# Patient Record
Sex: Male | Born: 1965 | Race: White | Hispanic: No | Marital: Married | Smoking: Former smoker
Health system: Southern US, Community
[De-identification: ages and names within clinical notes are randomized; demographics above are authoritative.]

## PROBLEM LIST (undated history)

## (undated) DIAGNOSIS — Z87442 Personal history of urinary calculi: Secondary | ICD-10-CM

## (undated) DIAGNOSIS — I509 Heart failure, unspecified: Secondary | ICD-10-CM

## (undated) DIAGNOSIS — I10 Essential (primary) hypertension: Secondary | ICD-10-CM

## (undated) DIAGNOSIS — N289 Disorder of kidney and ureter, unspecified: Secondary | ICD-10-CM

## (undated) DIAGNOSIS — I219 Acute myocardial infarction, unspecified: Secondary | ICD-10-CM

## (undated) HISTORY — PX: OTHER SURGICAL HISTORY: SHX169

## (undated) HISTORY — PX: CORONARY ANGIOPLASTY WITH STENT PLACEMENT: SHX49

---

## 2017-07-05 ENCOUNTER — Emergency Department
Admission: EM | Admit: 2017-07-05 | Discharge: 2017-07-05 | Disposition: A | Discharge status: Home / Self Care | Payer: BLUE CROSS/BLUE SHIELD | Attending: Emergency Medicine | Admitting: Emergency Medicine

## 2017-07-05 ENCOUNTER — Encounter: Payer: Self-pay | Admitting: Emergency Medicine

## 2017-07-05 ENCOUNTER — Emergency Department: Payer: BLUE CROSS/BLUE SHIELD

## 2017-07-05 DIAGNOSIS — K5792 Diverticulitis of intestine, part unspecified, without perforation or abscess without bleeding: Secondary | ICD-10-CM | POA: Diagnosis not present

## 2017-07-05 DIAGNOSIS — Z955 Presence of coronary angioplasty implant and graft: Secondary | ICD-10-CM | POA: Diagnosis not present

## 2017-07-05 DIAGNOSIS — R1032 Left lower quadrant pain: Secondary | ICD-10-CM | POA: Diagnosis present

## 2017-07-05 DIAGNOSIS — Z87891 Personal history of nicotine dependence: Secondary | ICD-10-CM | POA: Diagnosis not present

## 2017-07-05 HISTORY — DX: Acute myocardial infarction, unspecified: I21.9

## 2017-07-05 HISTORY — DX: Essential (primary) hypertension: I10

## 2017-07-05 HISTORY — DX: Disorder of kidney and ureter, unspecified: N28.9

## 2017-07-05 HISTORY — DX: Heart failure, unspecified: I50.9

## 2017-07-05 LAB — COMPREHENSIVE METABOLIC PANEL
ALBUMIN: 4.4 g/dL (ref 3.5–5.0)
ALT: 18 U/L (ref 17–63)
AST: 24 U/L (ref 15–41)
Alkaline Phosphatase: 37 U/L — ABNORMAL LOW (ref 38–126)
Anion gap: 10 (ref 5–15)
BUN: 10 mg/dL (ref 6–20)
CHLORIDE: 101 mmol/L (ref 101–111)
CO2: 27 mmol/L (ref 22–32)
CREATININE: 0.94 mg/dL (ref 0.61–1.24)
Calcium: 9.2 mg/dL (ref 8.9–10.3)
GFR calc Af Amer: >60 mL/min (ref >60)
GFR calc non Af Amer: >60 mL/min (ref >60)
GLUCOSE: 93 mg/dL (ref 65–99)
POTASSIUM: 4.4 mmol/L (ref 3.5–5.1)
SODIUM: 138 mmol/L (ref 135–145)
Total Bilirubin: 1.1 mg/dL (ref 0.3–1.2)
Total Protein: 7.9 g/dL (ref 6.5–8.1)

## 2017-07-05 LAB — CBC
HEMATOCRIT: 42.6 % (ref 40.0–52.0)
Hemoglobin: 14.1 g/dL (ref 13.0–18.0)
MCH: 29.4 pg (ref 26.0–34.0)
MCHC: 33.1 g/dL (ref 32.0–36.0)
MCV: 88.9 fL (ref 80.0–100.0)
PLATELETS: 199 10*3/uL (ref 150–440)
RBC: 4.79 MIL/uL (ref 4.40–5.90)
RDW: 14.4 % (ref 11.5–14.5)
WBC: 11.3 10*3/uL — AB (ref 3.8–10.6)

## 2017-07-05 LAB — URINALYSIS, COMPLETE (UACMP) WITH MICROSCOPIC
BACTERIA UA: NONE SEEN
Bilirubin Urine: NEGATIVE
Glucose, UA: NEGATIVE mg/dL
Hgb urine dipstick: NEGATIVE
Ketones, ur: NEGATIVE mg/dL
LEUKOCYTES UA: NEGATIVE
Nitrite: NEGATIVE
PH: 7 (ref 5.0–8.0)
Protein, ur: NEGATIVE mg/dL
Specific Gravity, Urine: 1.005 (ref 1.005–1.030)

## 2017-07-05 LAB — LIPASE, BLOOD: LIPASE: 31 U/L (ref 11–51)

## 2017-07-05 MED ORDER — HYDROCODONE-ACETAMINOPHEN 5-325 MG PO TABS: 1.0000 | ORAL_TABLET | ORAL | 0 refills | Status: DC | PRN

## 2017-07-05 MED ORDER — AMOXICILLIN-POT CLAVULANATE 875-125 MG PO TABS
ORAL_TABLET | ORAL | Status: AC
  Administered 2017-07-05: 1 via ORAL
  Filled 2017-07-05: qty 1

## 2017-07-05 MED ORDER — TRAMADOL HCL 50 MG PO TABS: 50.0000 mg | ORAL_TABLET | Freq: Four times a day (QID) | ORAL | 0 refills | Status: AC | PRN

## 2017-07-05 MED ORDER — IOPAMIDOL (ISOVUE-300) INJECTION 61%
30.0000 mL | Freq: Once | INTRAVENOUS | Status: AC | PRN
  Administered 2017-07-05: 30 mL via ORAL

## 2017-07-05 MED ORDER — ONDANSETRON HCL 4 MG PO TABS: 4.0000 mg | ORAL_TABLET | Freq: Every day | ORAL | 0 refills | Status: AC | PRN

## 2017-07-05 MED ORDER — AMOXICILLIN-POT CLAVULANATE 875-125 MG PO TABS
1.0000 | ORAL_TABLET | Freq: Once | ORAL | Status: AC
  Administered 2017-07-05: 1 via ORAL

## 2017-07-05 MED ORDER — AMOXICILLIN-POT CLAVULANATE 875-125 MG PO TABS: 1.0000 | ORAL_TABLET | Freq: Two times a day (BID) | ORAL | 0 refills | Status: AC

## 2017-07-05 MED ORDER — SODIUM CHLORIDE 0.9 % IV BOLUS
1000.0000 mL | Freq: Once | INTRAVENOUS | Status: AC
  Administered 2017-07-05: 1000 mL via INTRAVENOUS

## 2017-07-05 MED ORDER — IOPAMIDOL (ISOVUE-370) INJECTION 76%
100.0000 mL | Freq: Once | INTRAVENOUS | Status: AC | PRN
  Administered 2017-07-05: 100 mL via INTRAVENOUS

## 2017-07-05 NOTE — ED Triage Notes (Signed)
Pt comes into the ED via POV c/o left abdominal pain that radiates into the left testicle.  Patient in NAD at this time.  DEnies any N/V/D.  Patient has extensive h/o kidney stones but states this does not feel the same.  Denies any difficulty urinating at this time.  Denies any swelling in the testicle.

## 2017-07-05 NOTE — Discharge Instructions (Addendum)
Your work-up today shows diverticulitis.  Please take antibiotics as prescribed, pain and nausea medication as needed as written.  These follow-up with your primary care doctor.  Return to the emergency department for any acute worsening of abdominal pain or development of significant fever.

## 2017-07-05 NOTE — ED Provider Notes (Signed)
[facility] Emergency Department Provider Note  Time seen: 1:33 PM  I have reviewed the triage vital signs and the nursing notes.   HISTORY  Chief Complaint Abdominal Pain    HPI Kevin White is a 52 y.o. male with a past medical history of MI, kidney stones, presents to the emergency department with left lower quadrant abdominal pain.  According to the patient since yesterday he has been experiencing left lower quadrant abdominal pain, low-grade fevers and intermittent nausea.  Denies any diarrhea, denies black or bloody stool.  Does state earlier this week he was feeling rather bloated but that has since resolved.  No chest pain or shortness of breath.  No dysuria or hematuria.  History of kidney stones in the past but states this feels different.  Describes the pain currently as moderate dull pain.  Worse with any type of movement.   Past Medical History:  Diagnosis Date  . Myocardial infarct ([place])   . Renal disorder    kidney stones    There are no active problems to display for this patient.   Past Surgical History:  Procedure Laterality Date  . CORONARY ANGIOPLASTY WITH STENT PLACEMENT      Prior to Admission medications   Not on File    No Known Allergies  No family history on file.  Social History Social History   Tobacco Use  . Smoking status: Former Research scientist (life sciences)  . Smokeless tobacco: Never Used  Substance Use Topics  . Alcohol use: Yes  . Drug use: Not Currently    Review of Systems Constitutional: Low-grade fever, 99.6 in the emergency department. Eyes: Negative for visual complaints ENT: Negative for recent illness/congestion Cardiovascular: Negative for chest pain. Respiratory: Negative for shortness of breath. Gastrointestinal: Moderate left lower quadrant abdominal pain, positive for nausea.  Negative for vomiting or diarrhea.  Negative for black or bloody stool. Genitourinary: Negative for urinary compaints and negative for  hematuria. Musculoskeletal: Negative for musculoskeletal complaints Skin: Negative for skin complaints  Neurological: Negative for headache All other ROS negative  ____________________________________________   PHYSICAL EXAM:  VITAL SIGNS: ED Triage Vitals [07/05/17 1130]  Enc Vitals Group     BP (!) 150/79     Pulse Rate 80     Resp 18     Temp 99 F (37.2 C)     Temp Source Oral     SpO2 97 %     Weight (!) 320 lb (145.2 kg)     Height 6\' 1"  (1.854 m)     Head Circumference      Peak Flow      Pain Score 8     Pain Loc      Pain Edu?      Excl. in [place]?    Constitutional: Alert and oriented. Well appearing and in no distress. Eyes: Normal exam ENT   Head: Normocephalic and atraumatic.   Mouth/Throat: Mucous membranes are moist. Cardiovascular: Normal rate, regular rhythm. No murmur Respiratory: Normal respiratory effort without tachypnea nor retractions. Breath sounds are clear Gastrointestinal: Soft, moderate left lower quadrant abdominal pain.  No rebound or guarding.  No distention. Musculoskeletal: Nontender with normal range of motion in all extremities Neurologic:  Normal speech and language. No gross focal neurologic deficits  Skin:  Skin is warm, dry and intact.  Psychiatric: Mood and affect are normal.   ____________________________________________    RADIOLOGY  CT consistent with acute diverticulitis without complication.  ____________________________________________   INITIAL IMPRESSION / ASSESSMENT  AND PLAN / ED COURSE  Pertinent labs & imaging results that were available during my care of the patient were reviewed by me and considered in my medical decision making (see chart for details).  Patient presents to the emergency department with moderate left lower quadrant pain and low-grade fever.  Differential would include colitis, diverticulitis, urinary tract infection, pyelonephritis, ureterolithiasis.  Given the patient's exam with  moderate tenderness to palpation in the left lower quadrant I highly suspect diverticulitis/colitis.  We will obtain a CT scan of the abdomen/pelvis to further evaluate.  We will place an IV, IV hydrate while awaiting CT results.  Patient's basic lab work is largely within normal limits besides a very slight leukocytosis.  No blood in his urinalysis.  CT consistent with uncomplicated acute diverticulitis.  We will place the patient on antibiotics pain and nausea medication.  We will have the patient follow-up with his primary care doctor.  I discussed return precautions for any worsening pain.  ____________________________________________   FINAL CLINICAL IMPRESSION(S) / ED DIAGNOSES  Lower quadrant pain Acute diverticulitis   Harvest Dark, MD 07/05/17 1505

## 2017-11-28 ENCOUNTER — Other Ambulatory Visit: Payer: Self-pay | Admitting: Chiropractic Medicine

## 2017-11-28 DIAGNOSIS — M531 Cervicobrachial syndrome: Secondary | ICD-10-CM

## 2017-11-29 ENCOUNTER — Other Ambulatory Visit: Payer: Self-pay | Admitting: Chiropractic Medicine

## 2017-12-04 ENCOUNTER — Ambulatory Visit
Admission: RE | Admit: 2017-12-04 | Discharge: 2017-12-04 | Disposition: A | Discharge status: Home / Self Care | Payer: BLUE CROSS/BLUE SHIELD | Source: Ambulatory Visit | Attending: Chiropractic Medicine | Admitting: Chiropractic Medicine

## 2017-12-04 DIAGNOSIS — M47812 Spondylosis without myelopathy or radiculopathy, cervical region: Secondary | ICD-10-CM | POA: Insufficient documentation

## 2017-12-04 DIAGNOSIS — M531 Cervicobrachial syndrome: Secondary | ICD-10-CM | POA: Diagnosis present

## 2017-12-16 ENCOUNTER — Ambulatory Visit: Payer: BLUE CROSS/BLUE SHIELD

## 2018-07-15 ENCOUNTER — Other Ambulatory Visit: Payer: Self-pay | Admitting: Otolaryngology

## 2018-07-15 ENCOUNTER — Other Ambulatory Visit (HOSPITAL_COMMUNITY): Payer: Self-pay | Admitting: Otolaryngology

## 2018-07-15 DIAGNOSIS — R221 Localized swelling, mass and lump, neck: Secondary | ICD-10-CM

## 2018-07-18 ENCOUNTER — Ambulatory Visit
Admission: RE | Admit: 2018-07-18 | Discharge: 2018-07-18 | Disposition: A | Discharge status: Home / Self Care | Payer: BC Managed Care – PPO | Source: Ambulatory Visit | Attending: Otolaryngology | Admitting: Otolaryngology

## 2018-07-18 ENCOUNTER — Other Ambulatory Visit: Payer: Self-pay

## 2018-07-18 ENCOUNTER — Other Ambulatory Visit
Admission: RE | Admit: 2018-07-18 | Discharge: 2018-07-18 | Disposition: A | Discharge status: Home / Self Care | Payer: BC Managed Care – PPO | Source: Ambulatory Visit | Attending: Otolaryngology | Admitting: Otolaryngology

## 2018-07-18 ENCOUNTER — Encounter (INDEPENDENT_AMBULATORY_CARE_PROVIDER_SITE_OTHER): Payer: Self-pay

## 2018-07-18 DIAGNOSIS — R221 Localized swelling, mass and lump, neck: Secondary | ICD-10-CM | POA: Insufficient documentation

## 2018-07-18 LAB — CREATININE, SERUM
Creatinine, Ser: 0.93 mg/dL (ref 0.61–1.24)
GFR calc Af Amer: >60 mL/min (ref >60)
GFR calc non Af Amer: >60 mL/min (ref >60)

## 2018-07-18 LAB — BUN: BUN: 15 mg/dL (ref 6–20)

## 2018-07-18 MED ORDER — IOHEXOL 300 MG/ML  SOLN
75.0000 mL | Freq: Once | INTRAMUSCULAR | Status: AC | PRN
  Administered 2018-07-18: 75 mL via INTRAVENOUS

## 2018-07-22 ENCOUNTER — Other Ambulatory Visit: Payer: Self-pay | Admitting: Otolaryngology

## 2018-07-22 DIAGNOSIS — R221 Localized swelling, mass and lump, neck: Secondary | ICD-10-CM

## 2018-07-25 ENCOUNTER — Other Ambulatory Visit: Payer: Self-pay | Admitting: Radiology

## 2018-07-29 ENCOUNTER — Other Ambulatory Visit: Payer: Self-pay

## 2018-07-29 ENCOUNTER — Ambulatory Visit: Payer: BC Managed Care – PPO

## 2018-07-29 ENCOUNTER — Ambulatory Visit
Admission: RE | Admit: 2018-07-29 | Discharge: 2018-07-29 | Disposition: A | Discharge status: Home / Self Care | Payer: BC Managed Care – PPO | Source: Ambulatory Visit | Attending: Otolaryngology | Admitting: Otolaryngology

## 2018-07-29 ENCOUNTER — Other Ambulatory Visit: Payer: Self-pay | Admitting: Otolaryngology

## 2018-07-29 DIAGNOSIS — Z87891 Personal history of nicotine dependence: Secondary | ICD-10-CM | POA: Diagnosis not present

## 2018-07-29 DIAGNOSIS — C770 Secondary and unspecified malignant neoplasm of lymph nodes of head, face and neck: Secondary | ICD-10-CM | POA: Diagnosis not present

## 2018-07-29 DIAGNOSIS — Z955 Presence of coronary angioplasty implant and graft: Secondary | ICD-10-CM | POA: Insufficient documentation

## 2018-07-29 DIAGNOSIS — C801 Malignant (primary) neoplasm, unspecified: Secondary | ICD-10-CM | POA: Diagnosis not present

## 2018-07-29 DIAGNOSIS — I252 Old myocardial infarction: Secondary | ICD-10-CM | POA: Insufficient documentation

## 2018-07-29 DIAGNOSIS — R221 Localized swelling, mass and lump, neck: Secondary | ICD-10-CM

## 2018-07-29 DIAGNOSIS — Z7982 Long term (current) use of aspirin: Secondary | ICD-10-CM | POA: Insufficient documentation

## 2018-07-29 DIAGNOSIS — I110 Hypertensive heart disease with heart failure: Secondary | ICD-10-CM | POA: Insufficient documentation

## 2018-07-29 DIAGNOSIS — Z87442 Personal history of urinary calculi: Secondary | ICD-10-CM | POA: Diagnosis not present

## 2018-07-29 DIAGNOSIS — I509 Heart failure, unspecified: Secondary | ICD-10-CM | POA: Diagnosis not present

## 2018-07-29 DIAGNOSIS — Z79899 Other long term (current) drug therapy: Secondary | ICD-10-CM | POA: Insufficient documentation

## 2018-07-29 DIAGNOSIS — R590 Localized enlarged lymph nodes: Secondary | ICD-10-CM | POA: Diagnosis present

## 2018-07-29 HISTORY — DX: Personal history of urinary calculi: Z87.442

## 2018-07-29 MED ORDER — SODIUM CHLORIDE 0.9 % IV SOLN
INTRAVENOUS | Status: DC
  Administered 2018-07-29: 11:00:00 via INTRAVENOUS

## 2018-07-29 MED ORDER — FENTANYL CITRATE (PF) 100 MCG/2ML IJ SOLN
INTRAMUSCULAR | Status: AC | PRN
  Administered 2018-07-29: 50 ug via INTRAVENOUS
  Administered 2018-07-29: 25 ug via INTRAVENOUS

## 2018-07-29 MED ORDER — MIDAZOLAM HCL 5 MG/5ML IJ SOLN
INTRAMUSCULAR | Status: AC | PRN
  Administered 2018-07-29: 0.5 mg via INTRAVENOUS
  Administered 2018-07-29 (×2): 1 mg via INTRAVENOUS
  Administered 2018-07-29: 0.5 mg via INTRAVENOUS

## 2018-07-29 NOTE — H&P (Signed)
Chief Complaint: Patient was seen in consultation today for left cervical lymph node biopsy at the request of Bennett,Paul  Referring Physician(s): Bennett,Paul  Patient Status: [facility] - Out-pt  History of Present Illness: Kevin White is a 53 y.o. male who recently palpated a left neck mass. CT demonstrates a 2.5 x 3.5 cm enlarged left level IIb cervical lymph node. He presents today for biopsy. He has some discomfort in left neck related to the LN but denies fever, chills, other symptoms.  Past Medical History:  Diagnosis Date  . CHF (congestive heart failure) ([place])   . History of kidney stones   . Hypertension   . Myocardial infarct ([place])   . Renal disorder    kidney stones    Past Surgical History:  Procedure Laterality Date  . CORONARY ANGIOPLASTY WITH STENT PLACEMENT    . gastic sleeve      Allergies: Patient has no known allergies.  Medications: Prior to Admission medications   Medication Sig Start Date End Date Taking? Authorizing Provider  aspirin EC 81 MG tablet Take 81 mg by mouth daily.   Yes [provider]  lisinopril (ZESTRIL) 20 MG tablet Take 20 mg by mouth daily.   Yes [provider]  metoprolol succinate (TOPROL-XL) 25 MG 24 hr tablet Take 25 mg by mouth daily.   Yes [provider]  simvastatin (ZOCOR) 40 MG tablet Take 40 mg by mouth daily.   Yes [provider]  ondansetron (ZOFRAN) 4 MG tablet Take 1 tablet (4 mg total) by mouth daily as needed for nausea or vomiting. Patient not taking: Reported on 07/29/2018 07/05/17   Harvest Dark, MD     History reviewed. No pertinent family history.  Social History   Socioeconomic History  . Marital status: Married    Spouse name: Not on file  . Number of children: 3  . Years of education: Not on file  . Highest education level: Not on file  Occupational History  . Not on file  Social Needs  . Financial resource strain: Not hard at all  . Food  insecurity    Worry: Patient refused    Inability: Patient refused  . Transportation needs    Medical: Patient refused    Non-medical: Patient refused  Tobacco Use  . Smoking status: Former Smoker    Quit date: 07/29/2010    Years since quitting: 8.0  . Smokeless tobacco: Never Used  Substance and Sexual Activity  . Alcohol use: Yes    Alcohol/week: 2.0 standard drinks    Types: 2 Shots of liquor per week  . Drug use: Not Currently  . Sexual activity: Not on file  Lifestyle  . Physical activity    Days per week: 7 days    Minutes per session: 30 min  . Stress: Very much  Relationships  . Social connections    Talks on phone: More than three times a week    Gets together: More than three times a week    Attends religious service: Never    Active member of club or organization: No    Attends meetings of clubs or organizations: Never    Relationship status: Married  Other Topics Concern  . Not on file  Social History Narrative  . Not on file     Review of Systems: A 12 point ROS discussed and pertinent positives are indicated in the HPI above.  All other systems are negative.  Review of Systems  Constitutional: Negative.  HENT:       Palpable left neck mass.  Respiratory: Negative.   Cardiovascular: Negative.   Gastrointestinal: Negative.   Genitourinary: Negative.   Musculoskeletal: Negative.   Neurological: Negative.     Vital Signs: BP 130/81   Pulse 76   Temp 98.8 F (37.1 C) (Oral)   Resp 20   Ht 6\' 1"  (1.854 m)   Wt (!) 137.9 kg   SpO2 95%   BMI 40.11 kg/m   Physical Exam Vitals signs reviewed.  Constitutional:      General: He is not in acute distress.    Appearance: Normal appearance. He is not ill-appearing, toxic-appearing or diaphoretic.  HENT:     Head: Normocephalic and atraumatic.     Mouth/Throat:     Mouth: Mucous membranes are moist.     Pharynx: Oropharynx is clear. No oropharyngeal exudate or posterior oropharyngeal erythema.   Neck:     Musculoskeletal: Neck supple.     Comments: Visible bulge of upper lateral left neck with palpable enlarged lymph node. Cardiovascular:     Rate and Rhythm: Normal rate and regular rhythm.     Heart sounds: Normal heart sounds. No murmur. No friction rub. No gallop.   Pulmonary:     Effort: Pulmonary effort is normal. No respiratory distress.     Breath sounds: Normal breath sounds. No stridor. No wheezing, rhonchi or rales.  Abdominal:     General: Abdomen is flat. There is no distension.     Palpations: Abdomen is soft.     Tenderness: There is no abdominal tenderness. There is no guarding or rebound.  Musculoskeletal:        General: No swelling.  Lymphadenopathy:     Cervical: Cervical adenopathy present.  Skin:    General: Skin is warm and dry.  Neurological:     General: No focal deficit present.     Mental Status: He is alert and oriented to person, place, and time.     Imaging: Ct Soft Tissue Neck W Contrast  Result Date: 07/18/2018 CLINICAL DATA:  53 year old male with left neck mass noticed 2 days ago. No known injury. EXAM: CT NECK WITH CONTRAST TECHNIQUE: Multidetector CT imaging of the neck was performed using the standard protocol following the bolus administration of intravenous contrast. CONTRAST:  19mL OMNIPAQUE IOHEXOL 300 MG/ML  SOLN COMPARISON:  Cervical spine MRI 12/04/2017 FINDINGS: Pharynx and larynx: Negative larynx. Hypopharynx contours are within normal limits. At the level of the oropharynx just below the soft palate there is subtle asymmetric increased size of the left tonsil on series 2, image 40. No discrete tonsillar hyperenhancement or mass. Nasopharynx contours are within normal limits. Negative parapharyngeal spaces. Negative retropharyngeal space. Salivary glands: Negative sublingual space. Submandibular glands and parotid glands appear symmetric and within normal limits. Thyroid: Negative. Lymph nodes: The palpable area of concern marked on  coronal image 66 corresponds to a 35 millimeter long axis, 24 millimeter short axis heterogeneously enhancing enlarged and lobulated left level 2 B lymph node. See also series 2, image 51. Much smaller regional lymph nodes also appear mildly rounded and asymmetrically increased in size (coronal image 59). There is a adjacent mildly enlarged and heterogeneous left level IIa node measuring 11 millimeters short axis. Left level 4 lymph nodes are also mildly asymmetric but remain normal by size criteria on series 2, image 87. Other bilateral lymph node stations are normal. Vascular: Major vascular structures in the neck and at the skull base are patent. Left  greater than right ICA origin calcified atherosclerosis. Partially visible ICA siphon calcified plaque. Limited intracranial: Negative. Visualized orbits: Minimally included, negative. Mastoids and visualized paranasal sinuses: Clear. Skeleton: Mildly elongated stylohyoid ligament calcification. No acute or suspicious osseous lesion. Upper chest: Negative lung apices and visible superior mediastinum. IMPRESSION: Palpable area of concern corresponds to an enlarged, heterogeneous and malignant appearing left level 2 lymph node (35 mm long axis) with smaller surrounding abnormal nodes. No contralateral lymphadenopathy. 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 However, there is no discrete tonsillar mass by CT, so the differential diagnosis includes lymphoma and metastatic disease from a different primary. Electronically Signed   By: Genevie Ann M.D.   On: 07/18/2018 19:34    Labs:  CBC: No results for input(s): WBC, HGB, HCT, PLT in the last 8760 hours.  COAGS: No results for input(s): INR, APTT in the last 8760 hours.  BMP: Recent Labs    07/18/18 1350  BUN 15  CREATININE 0.93  GFRNONAA >60  GFRAA >60    Assessment and Plan:  For US guided biopsy of left  cervical lymph node today. Risks and benefits of cervical lymph node biopsy was discussed with the patient including, but not limited to bleeding, infection, damage to adjacent structures or low yield requiring additional tests. All of the questions were answered and there is agreement to proceed. Consent signed and in chart.  Thank you for this interesting consult.  I greatly enjoyed meeting Eluterio Seymour and look forward to participating in their care.  A copy of this report was sent to the requesting provider on this date.  Electronically Signed: Azzie Roup, MD 07/29/2018, 11:32 AM   I spent a total of 30 Minutes in face to face in clinical consultation, greater than 50% of which was counseling/coordinating care for left cervical lymph node biopsy.

## 2018-07-29 NOTE — Procedures (Signed)
Interventional Radiology Procedure Note  Procedure: US Guided core biopsy and aspiration of left cervical lymph node  Complications: None  Estimated Blood Loss: < 10 mL  Findings: 18 G core biopsy of 3 cm left level II cervical lymph node performed under US guidance.  Six core samples obtained and sent to Pathology. Additional FNA with 18G spinal needle yielded 4 mL of bloody, purulent fluid which was sent for culture studies.  Venetia Night. Kathlene Cote, M.D Pager:  [phone]

## 2018-07-31 ENCOUNTER — Other Ambulatory Visit: Payer: Self-pay | Admitting: Otolaryngology

## 2018-07-31 ENCOUNTER — Other Ambulatory Visit: Payer: Self-pay | Admitting: Anatomic Pathology & Clinical Pathology

## 2018-07-31 ENCOUNTER — Other Ambulatory Visit (HOSPITAL_COMMUNITY): Payer: Self-pay | Admitting: Otolaryngology

## 2018-07-31 DIAGNOSIS — C760 Malignant neoplasm of head, face and neck: Secondary | ICD-10-CM

## 2018-07-31 LAB — SURGICAL PATHOLOGY

## 2018-07-31 LAB — ACID FAST SMEAR (AFB, MYCOBACTERIA): Acid Fast Smear: NEGATIVE

## 2018-08-03 LAB — AEROBIC/ANAEROBIC CULTURE W GRAM STAIN (SURGICAL/DEEP WOUND)
Culture: NO GROWTH
Special Requests: NORMAL

## 2018-08-06 ENCOUNTER — Other Ambulatory Visit: Payer: Self-pay

## 2018-08-06 ENCOUNTER — Ambulatory Visit
Admission: RE | Admit: 2018-08-06 | Discharge: 2018-08-06 | Disposition: A | Discharge status: Home / Self Care | Payer: BC Managed Care – PPO | Source: Ambulatory Visit | Attending: Otolaryngology | Admitting: Otolaryngology

## 2018-08-06 DIAGNOSIS — C760 Malignant neoplasm of head, face and neck: Secondary | ICD-10-CM | POA: Insufficient documentation

## 2018-08-06 LAB — GLUCOSE, CAPILLARY: Glucose-Capillary: 102 mg/dL — ABNORMAL HIGH (ref 70–99)

## 2018-08-06 MED ORDER — FLUDEOXYGLUCOSE F - 18 (FDG) INJECTION
15.0000 | Freq: Once | INTRAVENOUS | Status: AC | PRN
  Administered 2018-08-06: 15.75 via INTRAVENOUS

## 2018-08-08 ENCOUNTER — Other Ambulatory Visit: Payer: BC Managed Care – PPO

## 2018-08-08 NOTE — Progress Notes (Signed)
Tumor Board Documentation  Kevin White was presented by Dr Dicie Beam and Dr Pryor Ochoa at our Tumor Board on 08/08/2018, which included representatives from medical oncology, radiation oncology, surgical, radiology, pathology, navigation, internal medicine, research, pulmonology.  Kevin White currently presents for new positive pathology, for discussion, for [place] with history of the following treatments: surgical intervention(s), active survellience.  Additionally, we reviewed previous medical and familial history, history of present illness, and recent lab results along with all available histopathologic and imaging studies. The tumor board considered available treatment options and made the following recommendations:   Patient wants treatment at [facility]  The following procedures/referrals were also placed: No orders of the defined types were placed in this encounter.   Clinical Trial Status: not discussed   Staging used:    AJCC Staging:       Group: Squamous Cell Carcinoma Neck  National site-specific guidelines   were discussed with respect to the case.  Tumor board is a meeting of clinicians from various specialty areas who evaluate and discuss patients for whom a multidisciplinary approach is being considered. Final determinations in the plan of care are those of the provider(s). The responsibility for follow up of recommendations given during tumor board is that of the provider.   Today's extended care, comprehensive team conference, Kevin White was not present for the discussion and was not examined.   Multidisciplinary Tumor Board is a multidisciplinary case peer review process.  Decisions discussed in the Multidisciplinary Tumor Board reflect the opinions of the specialists present at the conference without having examined the patient.  Ultimately, treatment and diagnostic decisions rest with the primary provider(s) and the patient.

## 2018-08-29 LAB — FUNGUS CULTURE WITH STAIN

## 2018-08-29 LAB — FUNGUS CULTURE RESULT

## 2018-08-29 LAB — FUNGAL ORGANISM REFLEX

## 2018-09-11 LAB — ACID FAST CULTURE WITH REFLEXED SENSITIVITIES (MYCOBACTERIA): Acid Fast Culture: NEGATIVE

## 2019-08-18 ENCOUNTER — Encounter: Payer: Self-pay | Admitting: Occupational Therapy

## 2019-08-18 ENCOUNTER — Ambulatory Visit: Payer: BC Managed Care – PPO | Attending: Hematology and Oncology | Admitting: Occupational Therapy

## 2019-08-18 ENCOUNTER — Other Ambulatory Visit: Payer: Self-pay

## 2019-08-18 DIAGNOSIS — R29898 Other symptoms and signs involving the musculoskeletal system: Secondary | ICD-10-CM | POA: Insufficient documentation

## 2019-08-18 DIAGNOSIS — I890 Lymphedema, not elsewhere classified: Secondary | ICD-10-CM | POA: Diagnosis not present

## 2019-08-22 NOTE — Therapy (Addendum)
[place] [facility] [address] [place], [address] Phone: [phone]   Fax:  [phone]  Occupational Therapy Evaluation  Patient Details  Name: Kevin White MRN: [id number] Date of Birth: 1965-07-28 Referring Provider (OT): Tobie Poet George,   Encounter Date: 08/18/2019   OT End of Session - 08/22/19 1609    Visit Number 1    Number of Visits 6    Date for OT Re-Evaluation 10/03/19    OT Start Time 1500    OT Stop Time 1620    OT Time Calculation (min) 80 min    Activity Tolerance Patient tolerated treatment well    Behavior During Therapy [facility] for tasks assessed/performed           Past Medical History:  Diagnosis Date  . CHF (congestive heart failure) ([place])   . History of kidney stones   . Hypertension   . Myocardial infarct ([place])   . Renal disorder    kidney stones    Past Surgical History:  Procedure Laterality Date  . CORONARY ANGIOPLASTY WITH STENT PLACEMENT    . gastic sleeve      There were no vitals filed for this visit.      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   Patient had 6 chemo tx in the past, 30 radiation treatments.  Neck ROM Rotation Right 75 degrees Left 70 degrees Flexion 50 degrees Extension 35 degrees Lateral flexion  Right 30 degrees Left 35 degrees Tighter turning to right laterally than left  Circumference of neck at adam's apple 44 cm Measurements from bottom on ear to lateral side of right nose at base 11.75 cm Bottom of ear to lateral side of left of nose at base 12 cm  Patient reports his gag reflex is less now and gargling is uncomfortable.   Patient instructed on use of band for chin and head at night to help with neck compression Instructed on elevation to help promote drainage and edema control  ROM of neck in all directions to promote increased motion  Patient was instructed on self directed MLD for head and neck for home program as per clinical protocol Performed by therapist as below: Handout issued with pictures and instructions  1.  Effleurage 2.  Activation with pumping into hollow place behind collarbones 3.  Stationary circles on the lateral cervical lymph nodes 4.  Stationary circles on the lymph nodes in from and behind the ear 5.   Repeat number 3 6.  Stationary circles on the submandibular lymph nodes. 7.  Repeat number 3 8.  Repeat number 2  9. Pump from under collar bone to shoulder 10.  Repeat number 1  Patient to perform daily to manage symptoms of lymphedema in head and neck.                 OT Education - 08/22/19 1608    Education Details plan of care, goals, treatment plan, MLD    Person(s) Educated Patient    Methods Explanation;Handout;Demonstration    Comprehension Verbalized understanding;Returned demonstration               OT Long Term Goals - 08/22/19 1635      OT LONG TERM GOAL #1   Title Patient will demonstrate understanding of MLD to head and neck to manage symptoms of lymphedema.    Baseline no current knowledge    Time 6    Period Weeks     Status New    Target Date 10/03/19      OT LONG TERM GOAL #2   Title Patient will be independent for home exercise program for ROM of neck    Baseline no current program at eval    Time 6    Period Weeks    Status New    Target Date 10/03/19      OT LONG TERM GOAL #3   Title Patient will be independent with use of mild compression to chin and neck as a part of home program    Baseline none at eval    Time 6    Period Weeks    Status New    Target Date 10/03/19                 Plan - 08/22/19 1610    Clinical Impression Statement Patient is a 54 yo male s/p malignant neoplasm of head, face and neck post chemo and radiation in Sept 2020.  Patient reports mild limitations in range of motion, thicking of tissue under chin and neck and edema in this area.  Patient would benefit from skilled OT services to address edema, ROM and MLD techniques to manage the symptoms of lymphedema of head and neck.    OT Occupational Profile and History Detailed Assessment- Review of Records and additional review of physical, cognitive, psychosocial history related to current functional performance    Occupational performance deficits (Please refer to evaluation for details): IADL's;Leisure    Body Structure / Function / Physical Skills ADL;ROM;Strength;Edema;IADL;Flexibility    Psychosocial Skills Environmental  Adaptations;Routines and Behaviors;Habits    Rehab Potential Good    Clinical Decision Making Limited treatment options, no task modification necessary    Comorbidities Affecting Occupational Performance: May have comorbidities impacting occupational performance    Modification or Assistance to Complete Evaluation  No modification of tasks or assist necessary to complete eval    OT Frequency 1x / week    OT Duration 6 weeks    OT Treatment/Interventions Therapeutic exercise;Manual Therapy;Manual lymph drainage;Patient/family education    Consulted and Agree with Plan of Care Patient            Patient will benefit from skilled therapeutic intervention in order to improve the following deficits and impairments:   Body Structure / Function / Physical Skills: ADL, ROM, Strength, Edema, IADL, Flexibility   Psychosocial Skills: Environmental  Adaptations,  Routines and Behaviors, Habits   Visit Diagnosis: Lymphedema, not elsewhere classified  Decreased ROM of neck    Problem List There are no problems to display for this patient.  Aggie Douse Oneita Jolly, OTR/L, CLT  Kialee Kham 08/22/2019, 4:40 PM  [place] [facility] [address], [place], [address] Phone: [phone]   Fax:  [phone]  Name: Kevin White MRN: [id number] Date of Birth: 06/14/1965

## 2019-08-26 NOTE — Addendum Note (Signed)
Addended by: Garlon Hatchet T on: 08/26/2019 08:45 AM   Modules accepted: Orders

## 2019-09-11 ENCOUNTER — Ambulatory Visit: Payer: BC Managed Care – PPO | Attending: Hematology and Oncology | Admitting: Occupational Therapy

## 2019-09-11 ENCOUNTER — Other Ambulatory Visit: Payer: Self-pay

## 2019-09-11 DIAGNOSIS — I890 Lymphedema, not elsewhere classified: Secondary | ICD-10-CM | POA: Diagnosis present

## 2019-09-11 DIAGNOSIS — R29898 Other symptoms and signs involving the musculoskeletal system: Secondary | ICD-10-CM | POA: Diagnosis not present

## 2019-09-11 NOTE — Therapy (Signed)
[place] [facility] [address] [place], [address] Phone: [phone]   Fax:  [phone]  Occupational Therapy Treatment  Patient Details  Name: Kevin White MRN: [id number] Date of Birth: [birth year] Referring Provider (OT): Tobie Poet [place],   Encounter Date: 09/11/2019   OT End of Session - 09/11/19 1620    Visit Number 2    Number of Visits 6    Date for OT Re-Evaluation 10/03/19    OT Start Time 1517    OT Stop Time 1600    OT Time Calculation (min) 43 min    Activity Tolerance Patient tolerated treatment well    Behavior During Therapy [facility] for tasks assessed/performed           Past Medical History:  Diagnosis Date  . CHF (congestive heart failure) ([place])   . History of kidney stones   . Hypertension   . Myocardial infarct ([place])   . Renal disorder    kidney stones    Past Surgical History:  Procedure Laterality Date  . CORONARY ANGIOPLASTY WITH STENT PLACEMENT    . gastic sleeve      There were no vitals filed for this visit.   Subjective Assessment - 09/11/19 1615    Subjective  I did not see a lot of progress in my motion to the sides - tight- mostly on this L side - and it makes difference if I close my bite or not when taking my head back - still hard this area in the front of my neck    Pertinent History Cancer diagnosis June 2020.  Malignant neoplasm of oropharynx, Status post chemoradiation, Patient underwent left tonsillectomy and laryngoscopy with biopsy on 09/02/18 which showed NED.    Patient Stated Goals Patient would like to manage edema of neck.    Currently in Pain? No/denies              [facility] OT Assessment - 09/11/19 0001      AROM   Cervical Flexion 50    Cervical Extension 35   tight bite 25   Cervical - Right Side Bend 30    Cervical - Left Side Bend 35    Cervical - Right Rotation 75    Cervical - Left Rotation 70              Patient reports  thickening in anterior neck area, feels restrictive when looking up.   Patient had 6 chemo tx in the past, 30 radiation treatments.  Neck AROM about the same this date: Rotation Right 75 degrees; Left 70 degrees Flexion 50 degrees;Extension 35 degrees Lateral flexion  Right 30 degrees; Left 35 degrees Did not show improvement Add this date PROM and prolonged stretches for lateral flexion of neck to R and L  To do after heat in shower and during day  And can do cervical extention hanging head over edge of bed- with open and tight bite  PROM and prolonged stretches done by OT for lateral flexion  Soft tissue mobs to lateral neck on L - using mini massager - did increase 3-5 degrees L and R  Sternocleidomastoid - stretch add for extention  to R  and looking to the R     Circumference of neck at adam's apple 44 cm - same  Soft tissue for fibrosis on anterior neck - pt can do 4 x day for 5-10 min prior to MLD   Patient instructed  Again  on use of band for chin and head at night to help with neck compression - wife report she had one for him Instructed on elevation to help promote drainage and edema control at night tine- when it is the worse   Patient was instructed again and review on self directed MLD for head and neck for home program as per clinical protocol Performed by therapist as below: Handout issued with pictures and instructions  1.  Stimulate bilateral axillary ln, Effleurage  and pump from collar bone to bilateral axilla 10 x 2.  Activation with pumping into hollow place behind collarbones 3.  Stationary circles on the lateral cervical lymph nodes and  4.Stationary circles on submandibulare ln 5.  Stationary circles on the lymph nodes in front and behind the ear 6.  Stationary circles on the submandibular lymph nodes. 7.  Repeat number 3 8.  Repeat number 2  9. Pump from under collar bone to shoulder 10.  Repeat number 1  Patient to perform daily to manage  symptoms of lymphedema in head and neck after soft tissue                  OT Education - 09/11/19 1619    Education Details HEP changes    Person(s) Educated Patient    Methods Explanation;Handout;Demonstration    Comprehension Verbalized understanding;Returned demonstration               OT Long Term Goals - 08/22/19 1635      OT LONG TERM GOAL #1   Title Patient will demonstrate understanding of MLD to head and neck to manage symptoms of lymphedema.    Baseline no current knowledge    Time 6    Period Weeks    Status New    Target Date 10/03/19      OT LONG TERM GOAL #2   Title Patient will be independent for home exercise program for ROM of neck    Baseline no current program at eval    Time 6    Period Weeks    Status New    Target Date 10/03/19      OT LONG TERM GOAL #3   Title Patient will be independent with use of mild compression to chin and neck as a part of home program    Baseline none at eval    Time 6    Period Weeks    Status New    Target Date 10/03/19                 Plan - 09/11/19 1620    Clinical Impression Statement Patient is a 54 yo male s/p malignant neoplasm of head, face and neck post chemo and radiation in Sept 2020.  Patient ont to reports mild limitations in range of motion with lateral flexion and looking up the worse -add this date PROM -and then thicking of tissue under chin and L side of neck and edema in this area. Add this date soft tissue massage or mobs for fibrosis -and review sequence of MLD - cannot just do randomly some of the steps.  Patient would benefit from cont skilled OT services to address fibrosis, edema, ROM and MLD techniques to manage the symptoms of lymphedema of head and neck.    OT Occupational Profile and History Detailed Assessment- Review of Records and additional review of physical, cognitive, psychosocial history related to current functional performance    Occupational performance  deficits (Please refer to evaluation  for details): IADL's;Leisure    Body Structure / Function / Physical Skills ADL;ROM;Strength;Edema;IADL;Flexibility    Psychosocial Skills Environmental  Adaptations;Routines and Behaviors;Habits    Rehab Potential Good    Clinical Decision Making Limited treatment options, no task modification necessary    Comorbidities Affecting Occupational Performance: May have comorbidities impacting occupational performance    Modification or Assistance to Complete Evaluation  No modification of tasks or assist necessary to complete eval    OT Frequency Biweekly    OT Duration 4 weeks    OT Treatment/Interventions Therapeutic exercise;Manual Therapy;Manual lymph drainage;Patient/family education    Consulted and Agree with Plan of Care Patient           Patient will benefit from skilled therapeutic intervention in order to improve the following deficits and impairments:   Body Structure / Function / Physical Skills: ADL, ROM, Strength, Edema, IADL, Flexibility   Psychosocial Skills: Environmental  Adaptations, Routines and Behaviors, Habits   Visit Diagnosis: Decreased ROM of neck  Lymphedema, not elsewhere classified    Problem List There are no problems to display for this patient.   Rosalyn Gess OTR/L,CLT 09/11/2019, 4:24 PM  [place] [facility] [address] [place], [address] Phone: [phone]   Fax:  [phone]  Name: Kevin White MRN: [id number] Date of Birth: 03/22/1965

## 2019-09-24 ENCOUNTER — Other Ambulatory Visit: Payer: Self-pay

## 2019-09-24 ENCOUNTER — Ambulatory Visit: Payer: BC Managed Care – PPO | Admitting: Occupational Therapy

## 2019-09-24 DIAGNOSIS — I890 Lymphedema, not elsewhere classified: Secondary | ICD-10-CM

## 2019-09-24 DIAGNOSIS — R29898 Other symptoms and signs involving the musculoskeletal system: Secondary | ICD-10-CM | POA: Diagnosis not present

## 2019-09-24 NOTE — Therapy (Signed)
[place] [facility] [address] [place], [address] Phone: [phone]   Fax:  [phone]  Occupational Therapy Treatment  Patient Details  Name: Kevin White MRN: [id number] Date of Birth: 08-03-1965 Referring Provider (OT): Tobie Poet [place],   Encounter Date: 09/24/2019   OT End of Session - 09/24/19 1711    Visit Number 3    Number of Visits 6    Date for OT Re-Evaluation 10/03/19    OT Start Time 1430    OT Stop Time 1514    OT Time Calculation (min) 44 min    Activity Tolerance Patient tolerated treatment well    Behavior During Therapy [facility] for tasks assessed/performed           Past Medical History:  Diagnosis Date  . CHF (congestive heart failure) ([place])   . History of kidney stones   . Hypertension   . Myocardial infarct ([place])   . Renal disorder    kidney stones    Past Surgical History:  Procedure Laterality Date  . CORONARY ANGIOPLASTY WITH STENT PLACEMENT    . gastic sleeve      There were no vitals filed for this visit.   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  Malignant neoplasm of oropharynx, Status post chemoradiation, Patient underwent left tonsillectomy and laryngoscopy with biopsy on 09/02/18 which showed NED.    Patient Stated Goals Patient would like to manage edema of neck.    Currently in Pain? No/denies              [facility] OT Assessment - 09/24/19 0001      AROM   Cervical Flexion 50    Cervical Extension 45   40 with bite   Cervical - Right Side Bend 35    Cervical - Left Side Bend 40    Cervical - Right Rotation 80    Cervical - Left Rotation 75           Pt with some tightness in pect , lats on L - and hip flexors -    Patient show this date upon palpation decrease in fibrosis and thickening in anterior neck area, show increase cervical extention   Patient had 6 chemo tx in the past, 30 radiation treatments.  Neck AROM did increase some in all planes - most tightness on L side of cervical - because of radiation : Rotation Right 80 degrees; Left 75 degrees Flexion 50 degrees;Extension 40 and 45  degrees Lateral flexion  Right 35 degrees; Left 40 degrees Pt to cont with  PROM and prolonged stretches for lateral flexion of neck to R and L  To do after heat in shower and during day  And can cont same HEP that he done this past 1-2 wks for cervical extention -with open and tight bite  PROM and prolonged stretches done by OT for lateral flexion with some light graston tool nr 4 sweeping - but about 8 strokes Soft tissue mobs to lateral neck on L - using mini massager  Sternocleidomastoid - stretch  To cont with for extention  to R  and looking to the R     Circumference of neck at adam's apple was  44 cm - and now 43.2  Soft tissue for fibrosis on anterior neck - pt can do 4 x day for 5-10 min prior to MLD  - cont - done great with this the last 1-2 wks  Patient instructed  Again on use of band for chin and head at night to help with neck compression - wife report she had one for him Instructed on elevation to help promote drainage and edema control at night tine- when it is the worse   Review again with patient  self directed MLD for head and neck for home program as per clinical protocol - but to slow down and light - min A needed  Performed by therapist as below: Handout issued with pictures and instructions  1. Stimulate bilateral axillary ln, Effleurage  and pump from collar bone to bilateral axilla 10 x 2. Activation with pumping into hollow place behind collarbones 3. Stationary circles on the lateral cervical  lymph nodes and  4.Stationary circles on submandibulare ln 5. Stationary circles on the lymph nodes in front and behind the ear 6. Stationary circles on the submandibular lymph nodes. 7. Repeat number 3 8. Repeat number 2  9. Pump from under collar bone to shoulder 10. Repeat number 1  Pt report he did felt one time some drainage or saliva down the throat -and also when OT done it this date with soft tissue   Patient to perform daily to manage symptoms of lymphedema in head and neck after soft tissue                     OT Education - 09/24/19 1711    Education Details stretches, MLD , soft tissue mobs    Person(s) Educated Patient    Methods Explanation;Handout;Demonstration    Comprehension Verbalized understanding;Returned demonstration               OT Long Term Goals - 08/22/19 1635      OT LONG TERM GOAL #1   Title Patient will demonstrate understanding of MLD to head and neck to manage symptoms of lymphedema.    Baseline no current knowledge    Time 6    Period Weeks    Status New    Target Date 10/03/19      OT LONG TERM GOAL #2   Title Patient will be independent for home exercise program for ROM of neck    Baseline no current program at eval    Time 6    Period Weeks    Status New    Target Date 10/03/19      OT LONG TERM GOAL #3   Title Patient will be independent with use of mild compression to chin and neck as a part of home program    Baseline none at eval    Time 6    Period Weeks    Status New    Target Date 10/03/19                 Plan - 09/24/19 1711    Clinical Impression Statement Ptis 55 yrs male s/p malignant neoplasm of head , face and neck post chemo and radiation Sept 2020 - pt show great progress in fibrosis  in anterior neck and cervical AROM - with extention increase 10 degrees - circumference around neck  decrease 0.8 cm , pt to cont with same HEP - do report some tightness under L arm and chest - as  well as hips- add 18 min yoga HEP for pt to do pain free    OT Occupational Profile and History Detailed Assessment- Review of Records and additional review of physical, cognitive, psychosocial history related to current functional performance    Occupational performance deficits (Please refer to evaluation for details): IADL's;Leisure    Body Structure / Function / Physical Skills ADL;ROM;Strength;Edema;IADL;Flexibility    Psychosocial Skills Environmental  Adaptations;Routines and Behaviors;Habits    Rehab Potential Good    Clinical Decision Making Limited treatment options, no task modification necessary    Comorbidities Affecting Occupational Performance: May have comorbidities impacting occupational performance    Modification or Assistance to Complete Evaluation  No modification of tasks or assist necessary to complete eval    OT Frequency Biweekly    OT Duration 2 weeks    OT Treatment/Interventions Therapeutic exercise;Manual Therapy;Manual lymph drainage;Patient/family education    Consulted and Agree with Plan of Care Patient           Patient will benefit from skilled therapeutic intervention in order to improve the following deficits and impairments:   Body Structure / Function / Physical Skills: ADL, ROM, Strength, Edema, IADL, Flexibility   Psychosocial Skills: Environmental  Adaptations, Routines and Behaviors, Habits   Visit Diagnosis: Lymphedema, not elsewhere classified    Problem List There are no problems to display for this patient.   Rosalyn Gess OTR/L,CLT 09/24/2019, 5:21 PM  [place] [facility] [address] [place], [address] Phone: [phone]   Fax:  [phone]  Name: Isai Gottlieb MRN: [id number] Date of Birth: 07-Nov-1965

## 2019-10-21 ENCOUNTER — Ambulatory Visit: Payer: BC Managed Care – PPO | Admitting: Occupational Therapy

## 2019-10-22 ENCOUNTER — Ambulatory Visit: Payer: BC Managed Care – PPO | Admitting: Occupational Therapy

## 2020-12-24 ENCOUNTER — Other Ambulatory Visit: Payer: Self-pay | Admitting: Physician Assistant

## 2020-12-24 DIAGNOSIS — Z859 Personal history of malignant neoplasm, unspecified: Secondary | ICD-10-CM

## 2020-12-24 DIAGNOSIS — R519 Headache, unspecified: Secondary | ICD-10-CM

## 2020-12-24 DIAGNOSIS — R110 Nausea: Secondary | ICD-10-CM

## 2020-12-31 IMAGING — CT NUCLEAR MEDICINE PET IMAGE INITIAL (PI) SKULL BASE TO THIGH
1 of 2 series · 13 of 30 positions shown, 17 images · non-contrast
Comparison: Neck CT 07/18/2018

CLINICAL DATA: Initial treatment strategy for head neck carcinoma.
LEFT neck biopsy period.

EXAM:
NUCLEAR MEDICINE PET SKULL BASE TO THIGH
TECHNIQUE: 15.8 mCi F-18 FDG was injected intravenously. Full-ring PET imaging
was performed from the skull base to thigh after the radiotracer. CT
data was obtained and used for attenuation correction and anatomic
localization.
Fasting blood glucose: 102 mg/dl

[Series 3: ct wb 5.0 b30f · axial · 0.98mm/px · z∈[-1010,-168]mm · 13 of 329 slices shown, 17 images]
[im 24/329  brain]
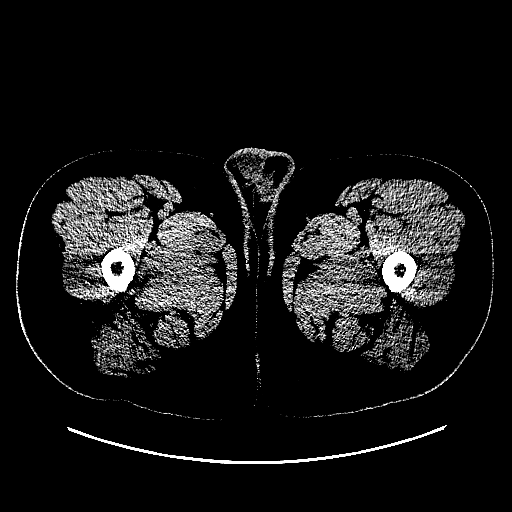
[im 24/329  bone]
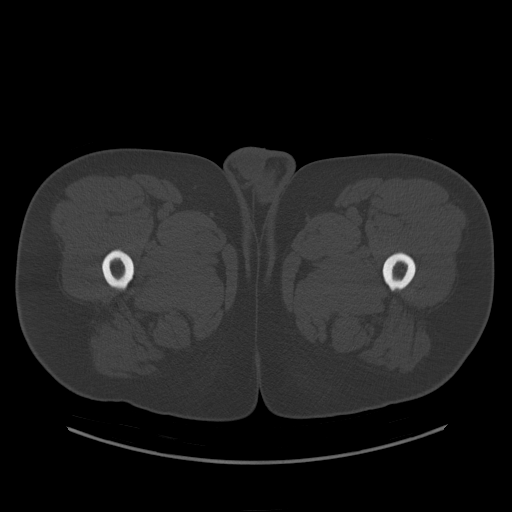
[im 47/329  brain]
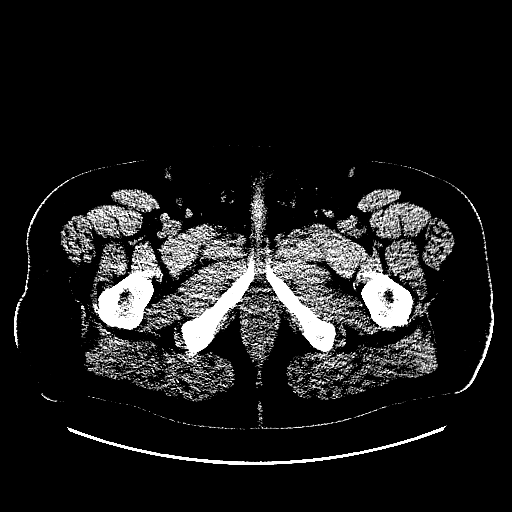
[im 71/329  brain]
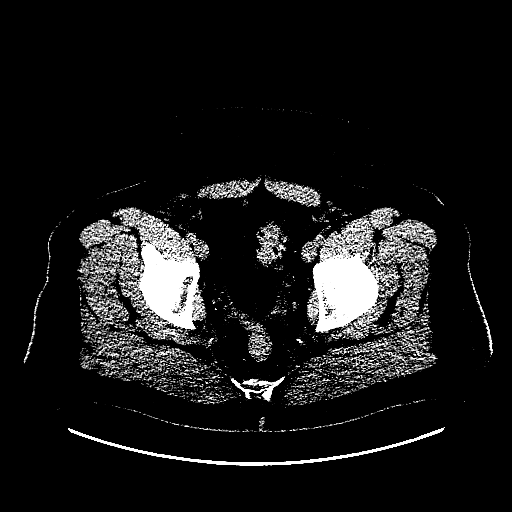
[im 94/329  brain]
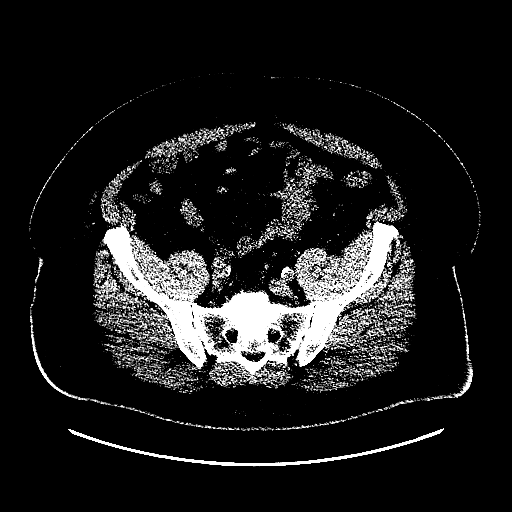
[im 118/329  brain]
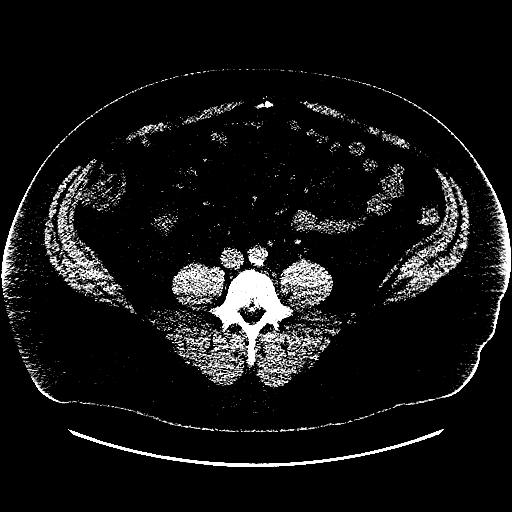
[im 118/329  bone]
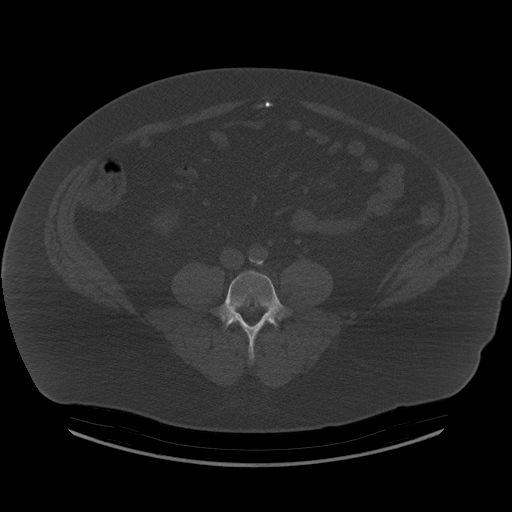
[im 141/329  brain]
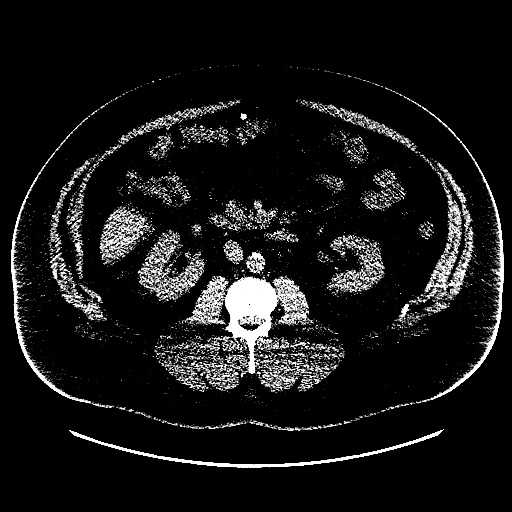
[im 165/329  brain]
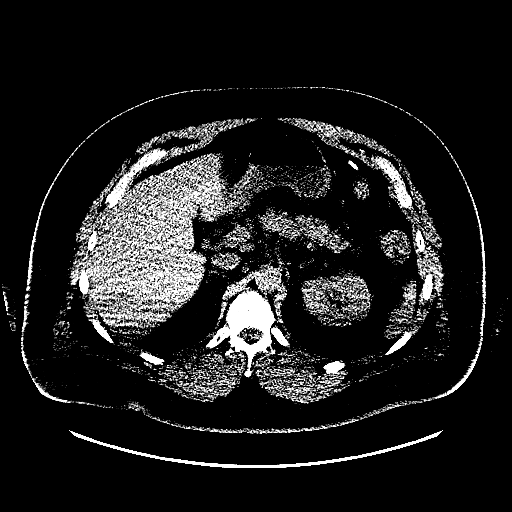
[im 188/329  brain]
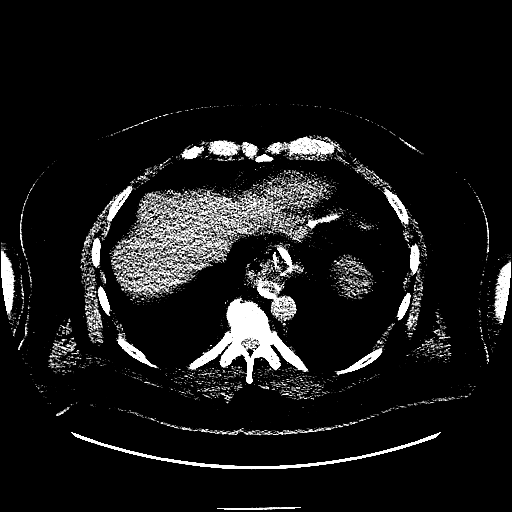
[im 211/329  brain]
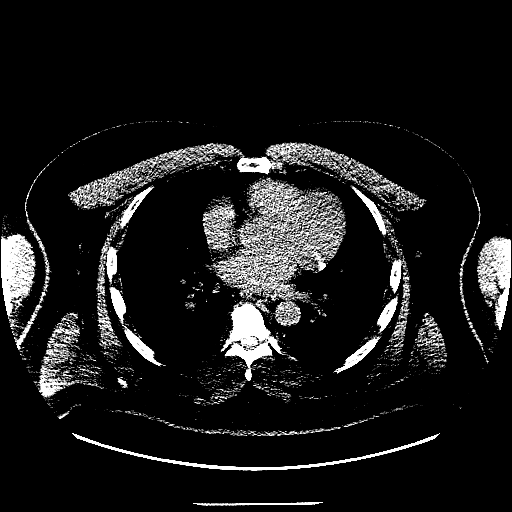
[im 211/329  bone]
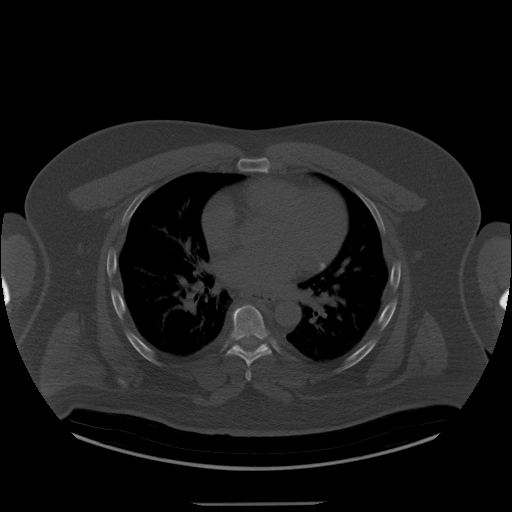
[im 235/329  brain]
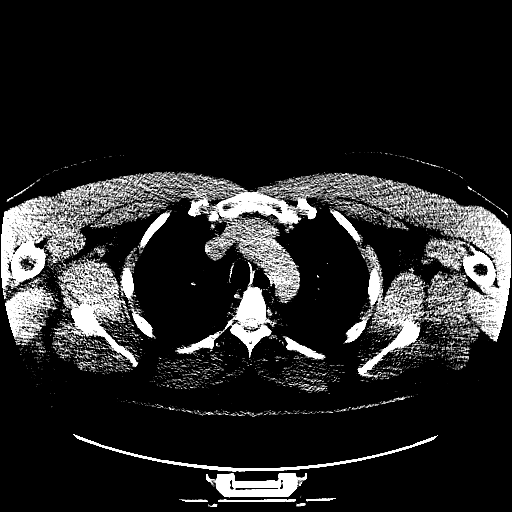
[im 258/329  brain]
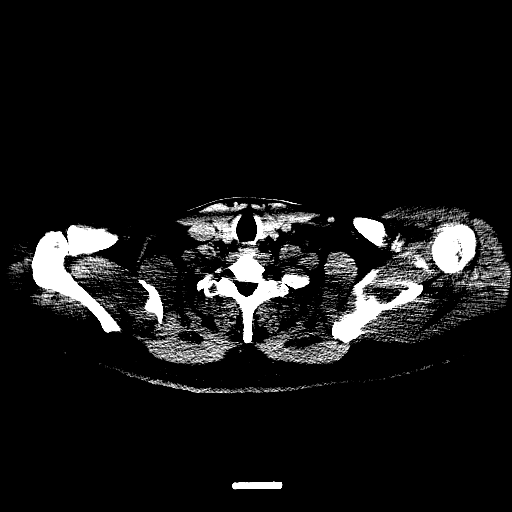
[im 282/329  brain]
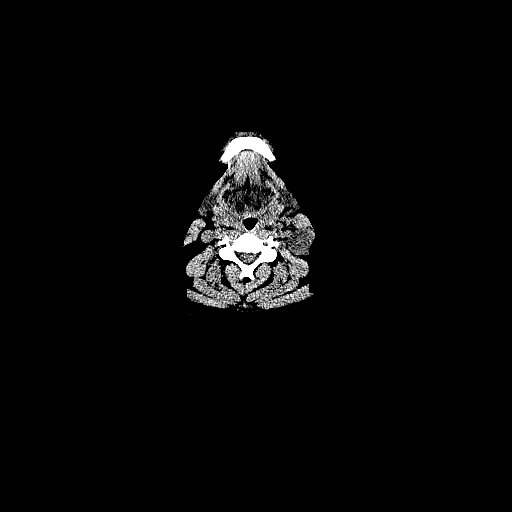
[im 305/329  brain]
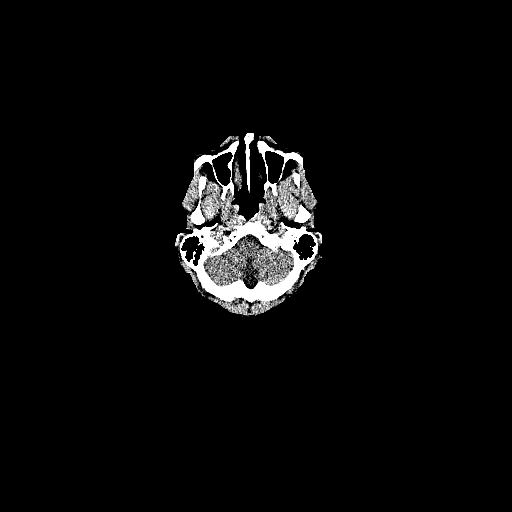
[im 305/329  bone]
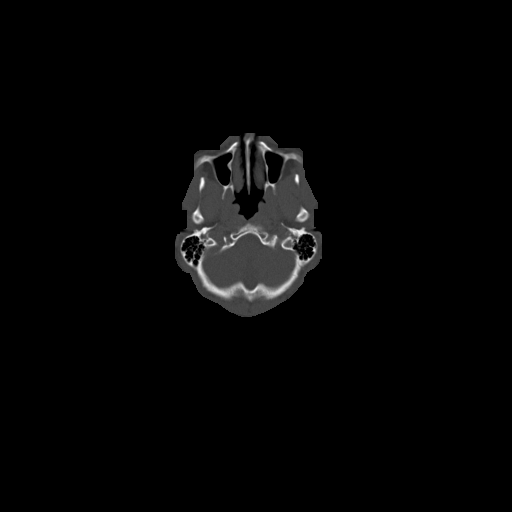

[13 of 30 positions shown; findings below may reference images not displayed]

FINDINGS: Mediastinal blood pool activity: SUV max

Liver activity: SUV max NA

NECK: Within the LEFT neck, there is a hypermetabolic LEFT level 2
lymph node beneath the sternocleidomastoid muscle measuring 2.5 cm
SUV max equal 7.3. Adjacent smaller more anterior lymph node at the
same node station measures 1.3 cm with SUV max equal 7.8.

Hypermetabolic activity at the LEFT and RIGHT base of tongue with
slight asymmetry with the LEFT base of tongue more metabolically
active the RIGHT.

Incidental CT findings: none

CHEST: No hypermetabolic mediastinal or hilar nodes. No suspicious
pulmonary nodules on the CT scan.

Incidental CT findings: none

ABDOMEN/PELVIS: No abnormal hypermetabolic activity within the
liver, pancreas, adrenal glands, or spleen. No hypermetabolic lymph
nodes in the abdomen or pelvis.

Incidental CT findings: Gastric banding device place. No
complicating features. Atherosclerotic calcification of the aorta.

SKELETON:

Incidental CT findings: none
IMPRESSION: 1. Two hypermetabolic LEFT level 2 lymph nodes consistent with
metastatic adenopathy.
2. No clear primary head and neck lesion. Metabolic at the LEFT and
RIGHT base of tongue greater on the LEFT. Recommend evaluation of
this region (LEFT base of tongue/lingual tonsil).
3. No distant metastatic disease.

## 2021-01-05 ENCOUNTER — Ambulatory Visit
Admission: RE | Admit: 2021-01-05 | Discharge: 2021-01-05 | Disposition: A | Discharge status: Home / Self Care | Payer: BC Managed Care – PPO | Source: Ambulatory Visit | Attending: Physician Assistant | Admitting: Physician Assistant

## 2021-01-05 DIAGNOSIS — R519 Headache, unspecified: Secondary | ICD-10-CM | POA: Insufficient documentation

## 2021-01-05 DIAGNOSIS — Z859 Personal history of malignant neoplasm, unspecified: Secondary | ICD-10-CM | POA: Diagnosis present

## 2021-01-05 DIAGNOSIS — R110 Nausea: Secondary | ICD-10-CM | POA: Diagnosis present

## 2021-01-05 MED ORDER — GADOBUTROL 1 MMOL/ML IV SOLN
10.0000 mL | Freq: Once | INTRAVENOUS | Status: AC | PRN
  Administered 2021-01-05: 10 mL via INTRAVENOUS
